# Patient Record
Sex: Female | Born: 1980 | Race: Asian | Hispanic: No | Marital: Married | Smoking: Never smoker
Health system: Southern US, Community
[De-identification: ages and names within clinical notes are randomized; demographics above are authoritative.]

---

## 2003-08-01 ENCOUNTER — Inpatient Hospital Stay (HOSPITAL_COMMUNITY): Admission: AD | Admit: 2003-08-01 | Discharge: 2003-08-01 | Payer: Self-pay | Admitting: *Deleted

## 2003-08-29 ENCOUNTER — Inpatient Hospital Stay (HOSPITAL_COMMUNITY): Admission: AD | Admit: 2003-08-29 | Discharge: 2003-08-29 | Payer: Self-pay | Admitting: *Deleted

## 2003-09-11 ENCOUNTER — Inpatient Hospital Stay (HOSPITAL_COMMUNITY): Admission: RE | Admit: 2003-09-11 | Discharge: 2003-09-11 | Payer: Self-pay | Admitting: *Deleted

## 2003-10-10 ENCOUNTER — Other Ambulatory Visit: Admission: RE | Admit: 2003-10-10 | Discharge: 2003-10-10 | Payer: Self-pay | Admitting: Obstetrics and Gynecology

## 2004-04-28 ENCOUNTER — Inpatient Hospital Stay (HOSPITAL_COMMUNITY): Admission: AD | Admit: 2004-04-28 | Discharge: 2004-05-02 | Payer: Self-pay | Admitting: Obstetrics and Gynecology

## 2005-04-20 IMAGING — US US OB TRANSVAGINAL MODIFY
1 series · 14 of 28 positions shown · non-contrast
Comparison: none

CLINICAL DATA: Abdominal pain.  By dates the patient is expected to be 6 weeks pregnant.
 EARLY OBSTETRICAL ULTRASOUND WITH TRANSVAGINAL:
 Right ovary is 24 x 41 mm, unremarkable.  There is a gestational sac in the uterine cavity near the fundus with a yolk sac evident.  Left ovary is 16 x 18 x 26 mm.  Gestational sac mean diameter of 12.5 mm corresponding to an estimated gestational age of 6 weeks 1 day.  Fetal pole is noted with crown rump length of 3 mm corresponding to an estimated gestational age of 5 weeks 6 days.  No free fluid.
 IMPRESSION
 6 week 1 day intrauterine gestation without apparent complication.

[Series 1: unknown · 0.22mm/px · 14 of 29 slices shown]
[im 2/29]
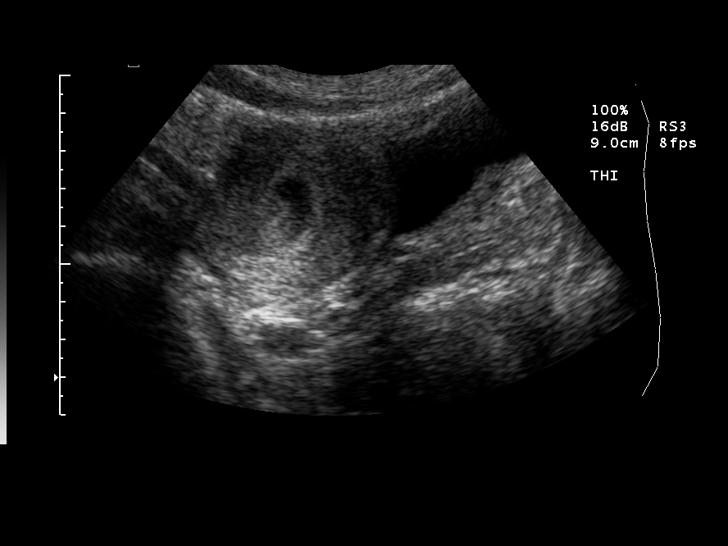
[im 4/29]
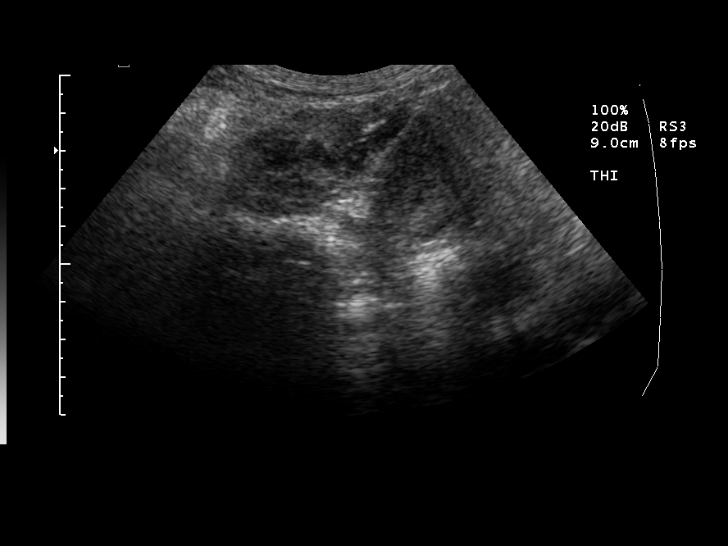
[im 6/29]
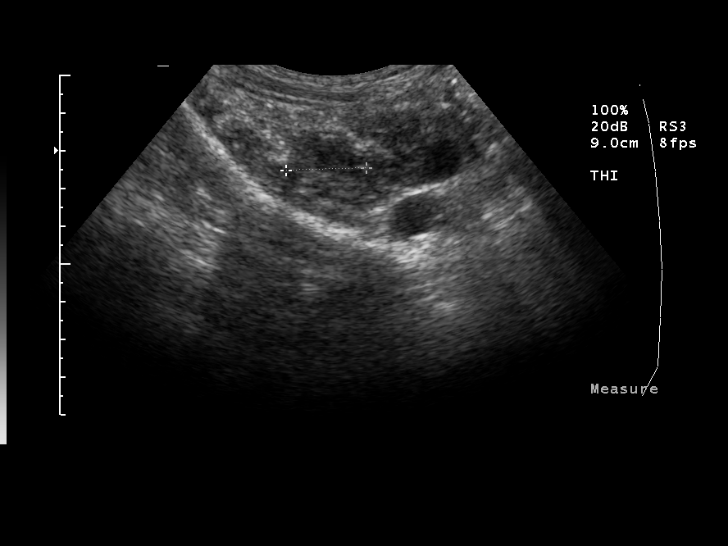
[im 8/29]
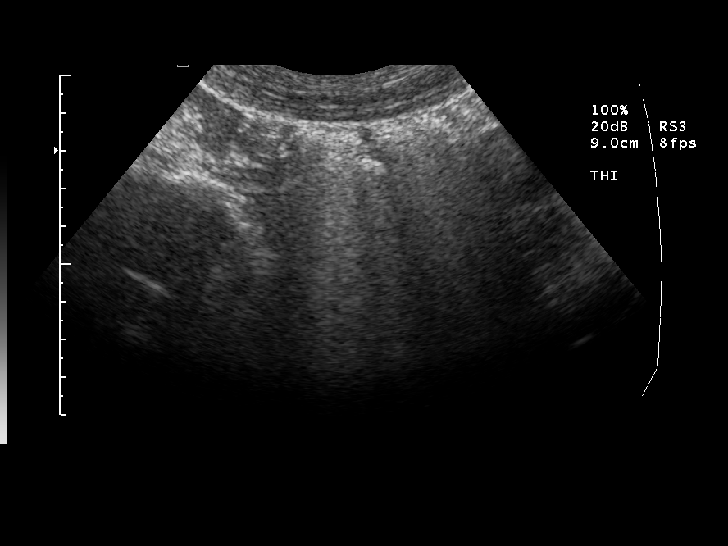
[im 10/29]
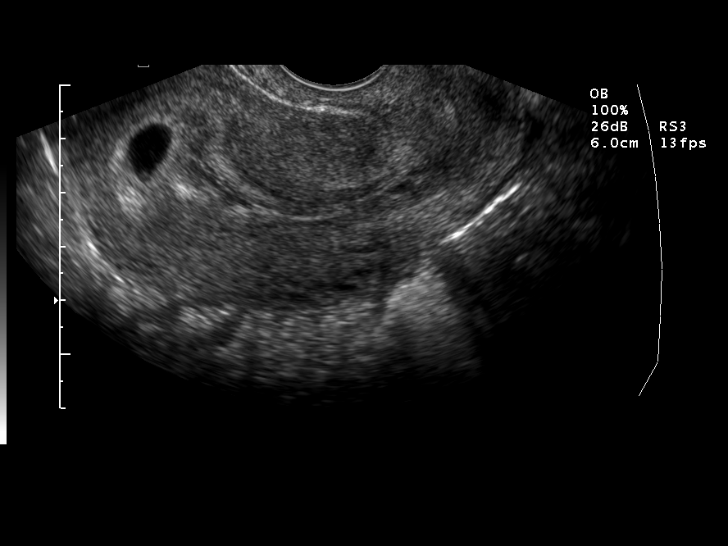
[im 12/29]
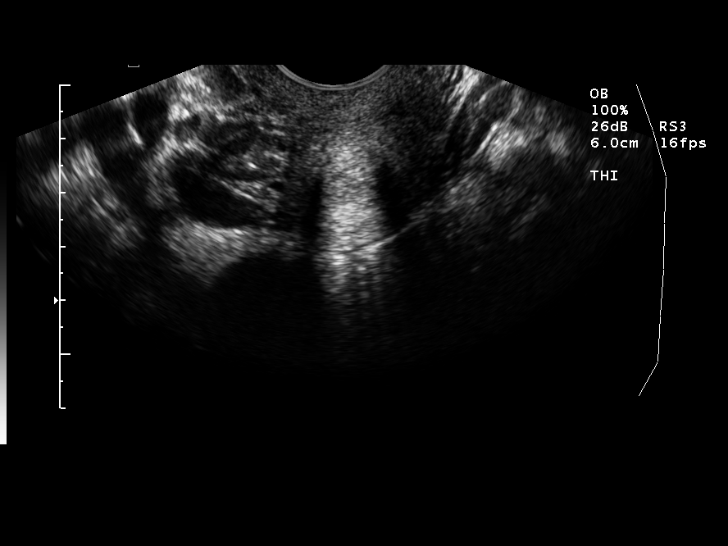
[im 14/29]
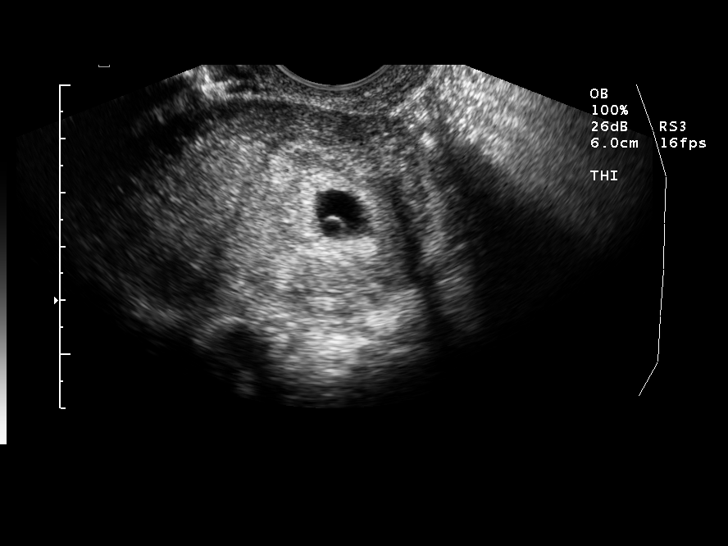
[im 16/29]
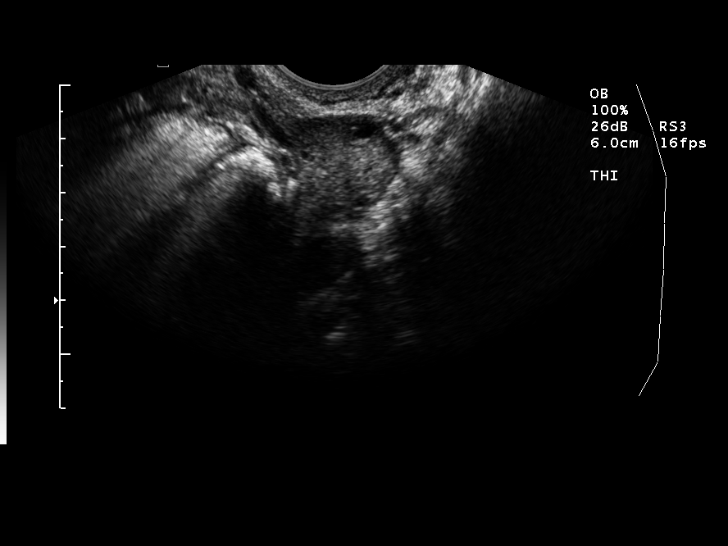
[im 18/29]
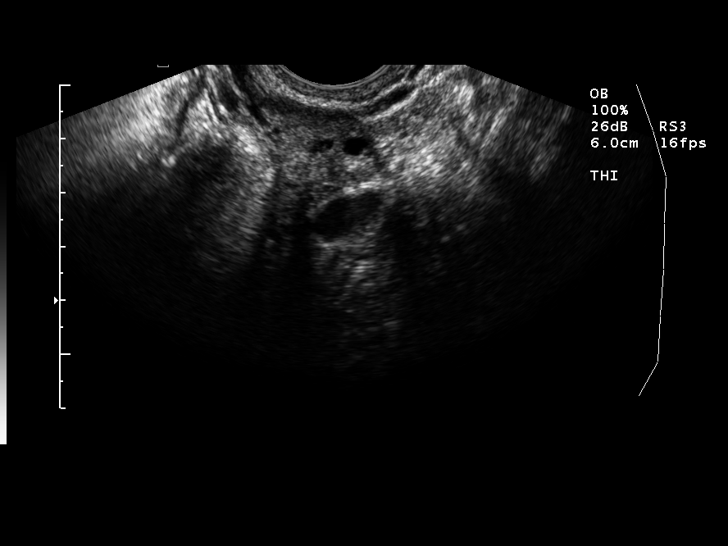
[im 20/29]
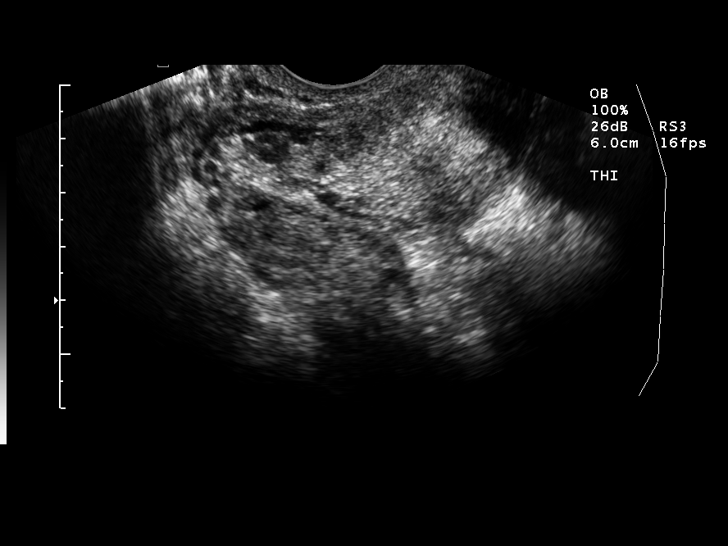
[im 22/29]
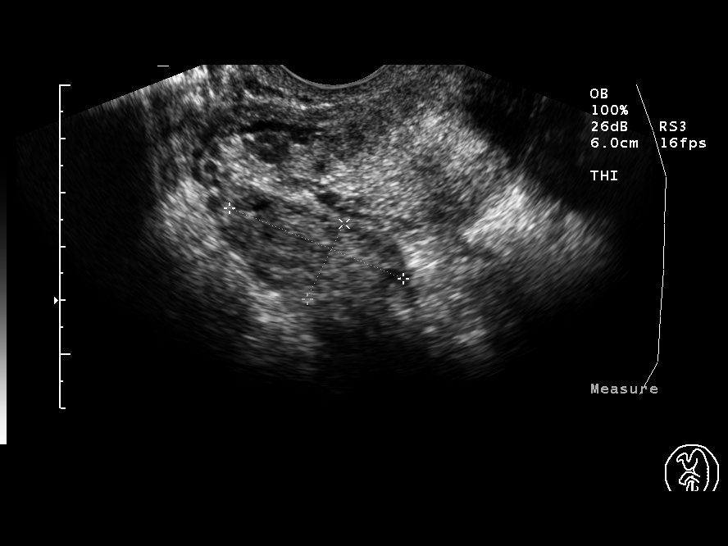
[im 24/29]
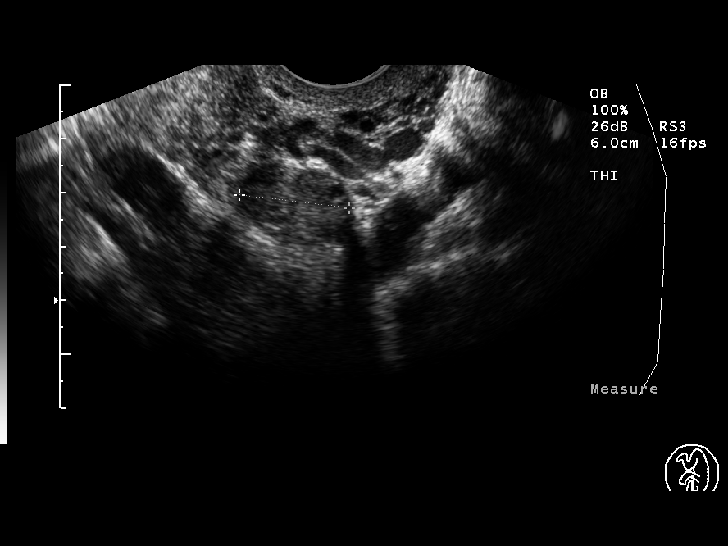
[im 26/29]
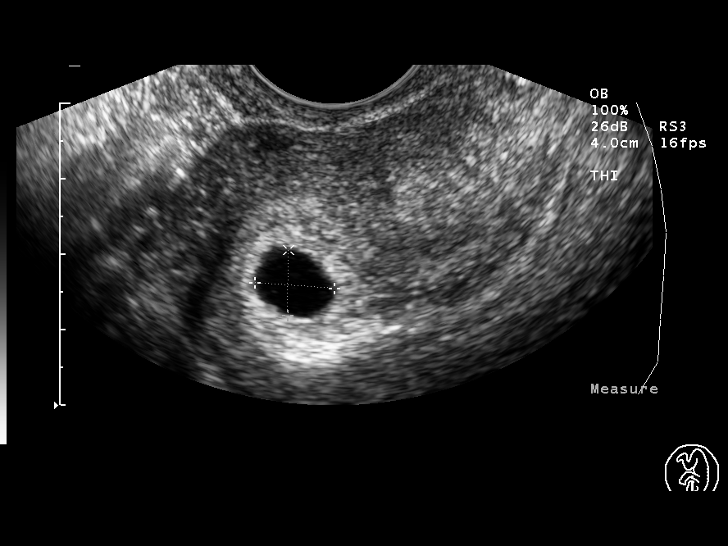
[im 29/29]
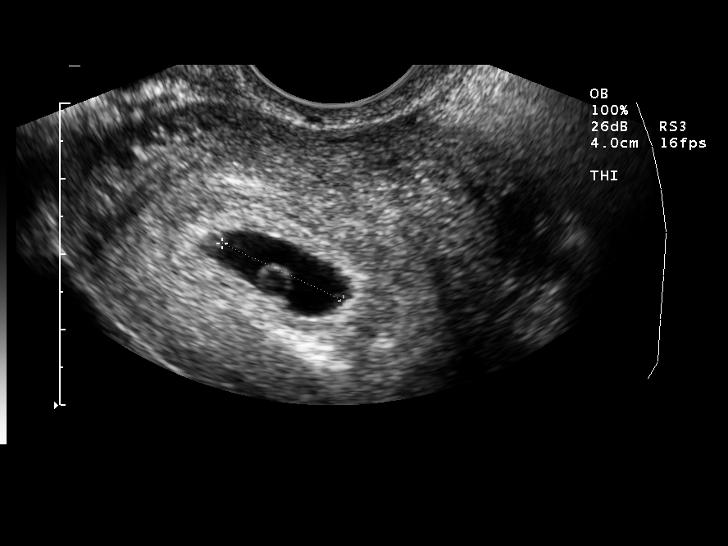

[14 of 28 positions shown; findings below may reference images not displayed]

## 2005-05-03 IMAGING — US US OB TRANSVAGINAL
1 series · 14 of 19 positions shown · non-contrast
Comparison: none

CLINICAL DATA: Reassess cardiac activity.
 TRANSVAGINAL OBSTETRICAL ULTRASOUND:
 Comparison is made with the previous exam dated 08/29/03.
 Multiple images of the gravid uterus were obtained using an endovaginal approach.
 There is a single intrauterine pregnancy identified that demonstrates an estimated gestational age by crown rump length of 8 weeks and 0 days.  Positive regular fetal cardiac activity with a rate of 172 bpm was noted.  A normal appearing yolk sac is seen.  No evidence for subchorionic hemorrhage is noted.
 Both ovaries are seen and have a normal appearance with the left ovary measuring 2.9 x 1.5 x 1.6 cm and the right ovary measuring 3.1 x 1.7 x 1.9 cm.  The right ovary contains a corpus luteum cyst.  No cul-de-sac or periovarian fluid is seen.
 IMPRESSION
 8 week 0 day living intrauterine pregnancy.  Normal ovaries.

[Series 1: unknown · 0.24mm/px · 14 of 19 slices shown]
[im 1/19]
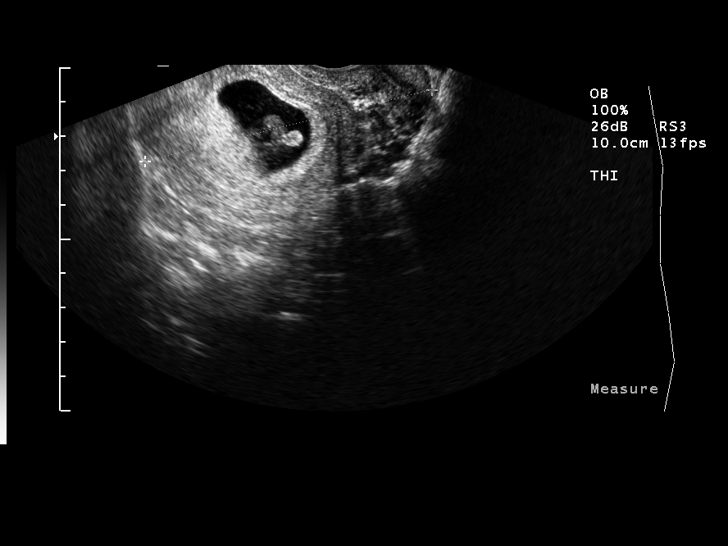
[im 3/19]
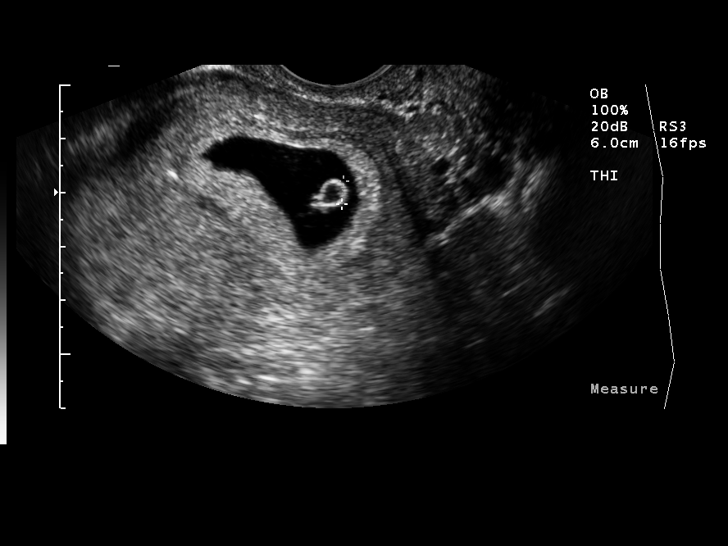
[im 4/19]
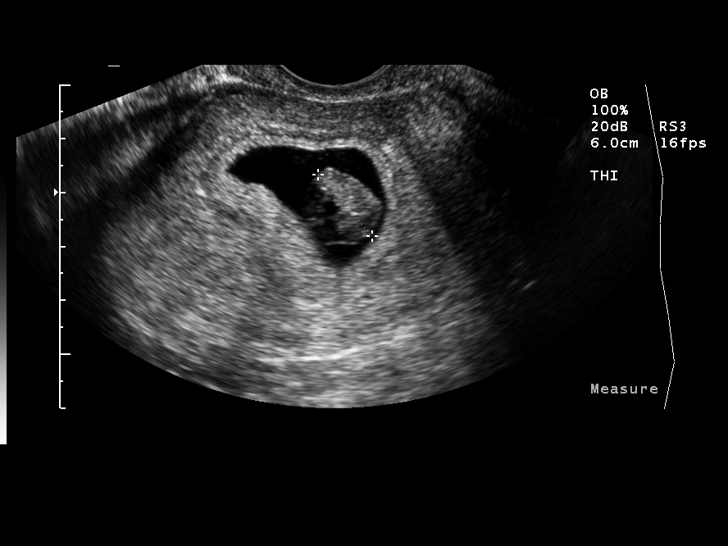
[im 5/19]
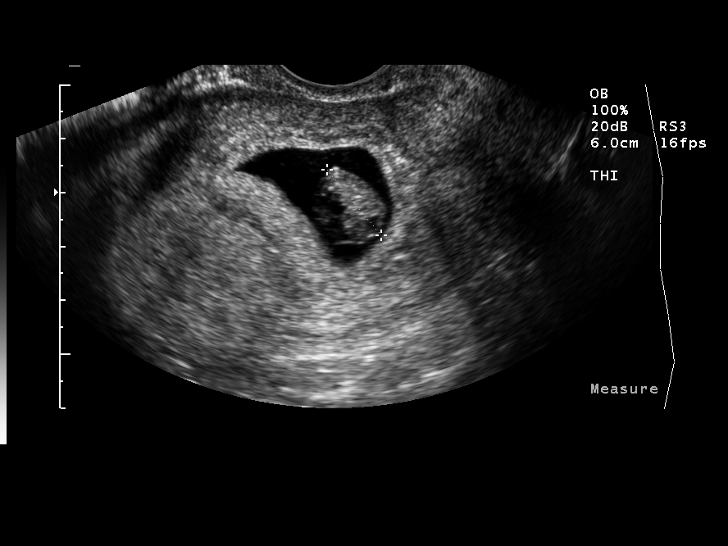
[im 7/19]
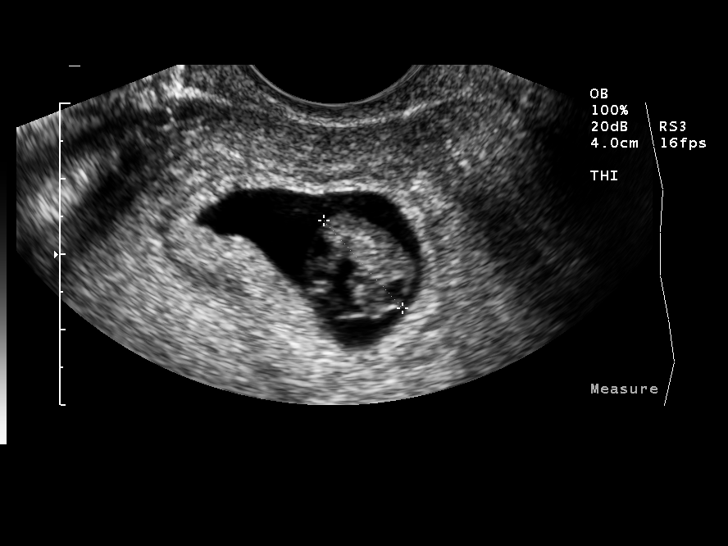
[im 8/19]
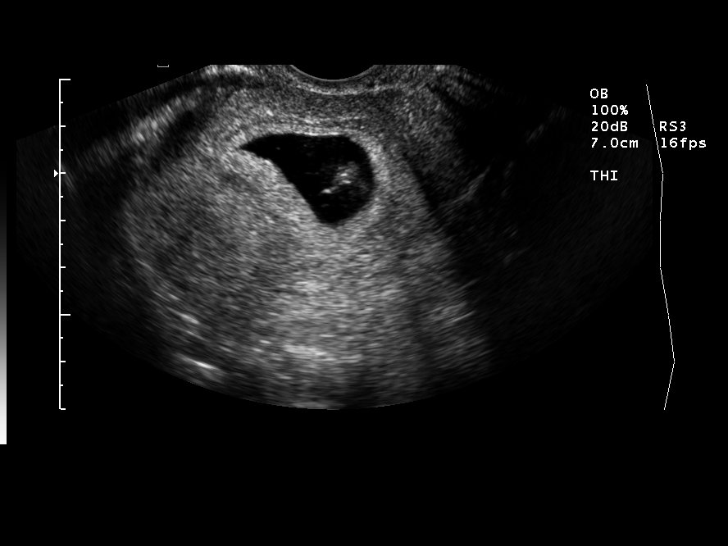
[im 9/19]
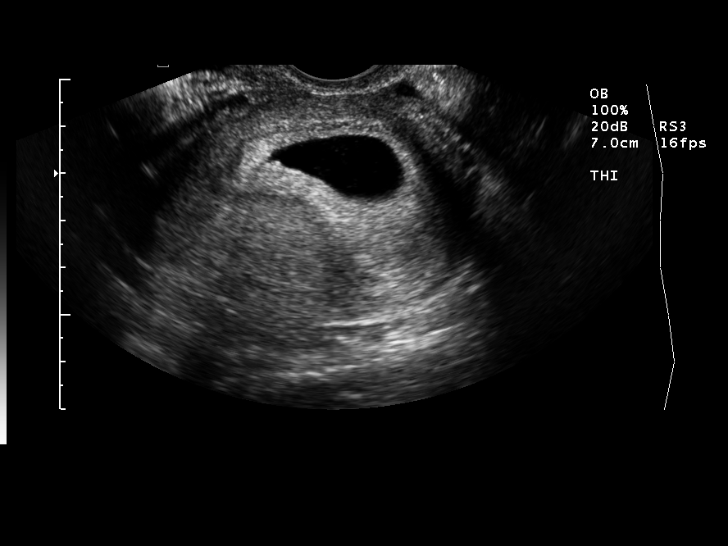
[im 11/19]
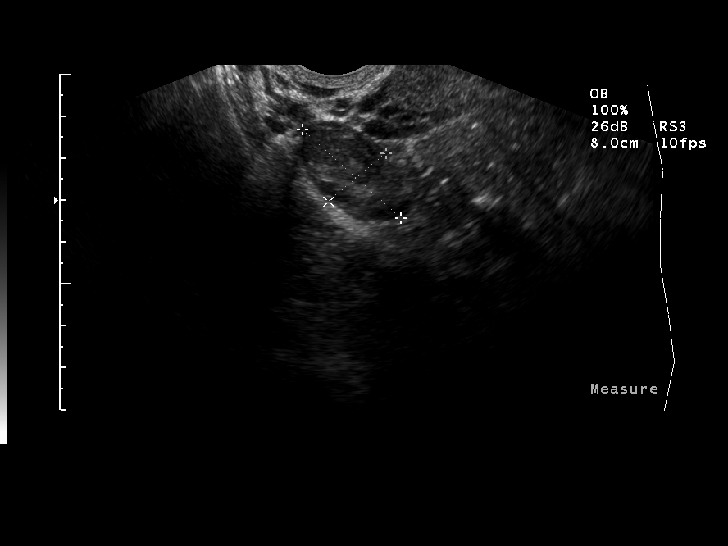
[im 12/19]
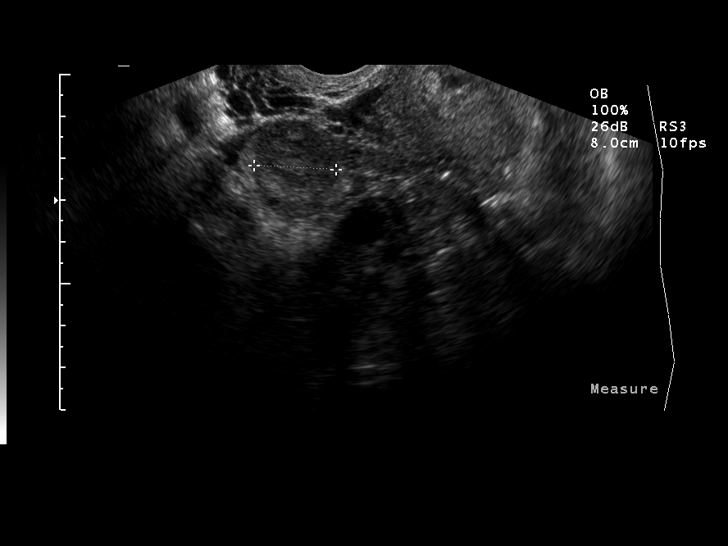
[im 13/19]
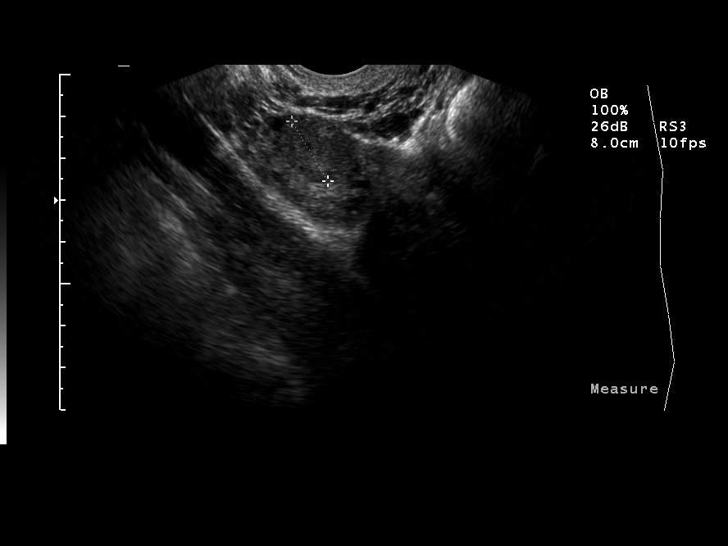
[im 15/19]
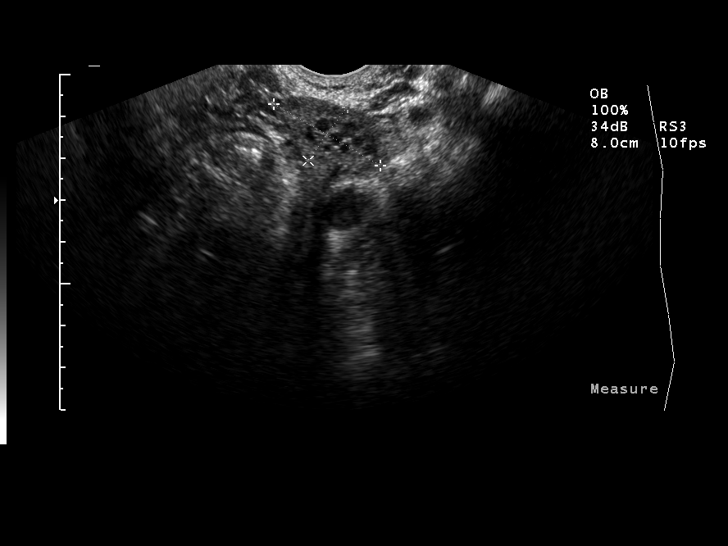
[im 16/19]
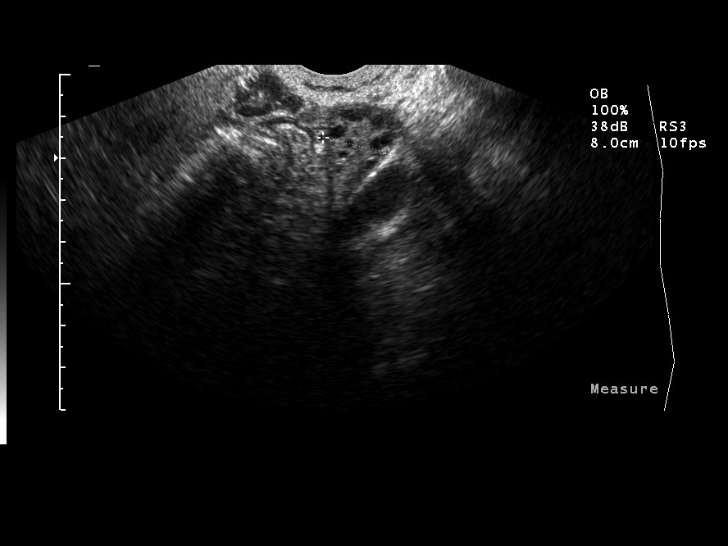
[im 17/19]
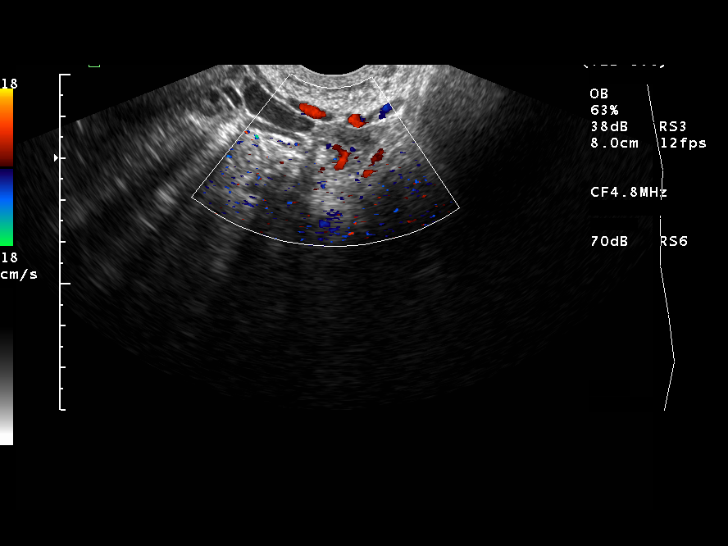
[im 19/19]
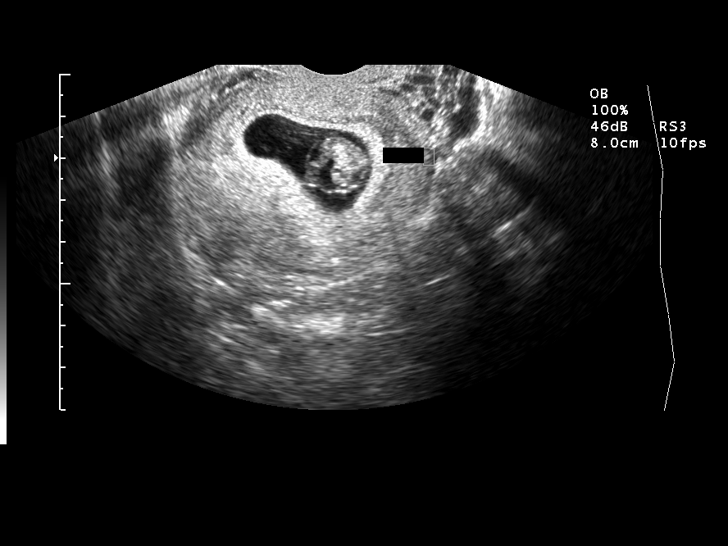

[14 of 19 positions shown; findings below may reference images not displayed]

## 2006-03-25 ENCOUNTER — Ambulatory Visit (HOSPITAL_BASED_OUTPATIENT_CLINIC_OR_DEPARTMENT_OTHER): Admission: RE | Admit: 2006-03-25 | Discharge: 2006-03-25 | Payer: Self-pay | Admitting: Surgery

## 2006-03-26 ENCOUNTER — Emergency Department (HOSPITAL_COMMUNITY): Admission: EM | Admit: 2006-03-26 | Discharge: 2006-03-26 | Payer: Self-pay | Admitting: Emergency Medicine

## 2011-01-31 ENCOUNTER — Other Ambulatory Visit: Payer: Self-pay | Admitting: Family Medicine

## 2011-01-31 ENCOUNTER — Other Ambulatory Visit (HOSPITAL_COMMUNITY)
Admission: RE | Admit: 2011-01-31 | Discharge: 2011-01-31 | Disposition: A | Discharge status: Home / Self Care | Payer: BC Managed Care – PPO | Source: Ambulatory Visit | Attending: Family Medicine | Admitting: Family Medicine

## 2011-01-31 DIAGNOSIS — Z01419 Encounter for gynecological examination (general) (routine) without abnormal findings: Secondary | ICD-10-CM | POA: Insufficient documentation

## 2014-02-13 ENCOUNTER — Other Ambulatory Visit: Payer: Self-pay | Admitting: Family Medicine

## 2014-02-13 ENCOUNTER — Other Ambulatory Visit (HOSPITAL_COMMUNITY)
Admission: RE | Admit: 2014-02-13 | Discharge: 2014-02-13 | Disposition: A | Discharge status: Home / Self Care | Payer: BC Managed Care – PPO | Source: Ambulatory Visit | Attending: Family Medicine | Admitting: Family Medicine

## 2014-02-13 DIAGNOSIS — Z01419 Encounter for gynecological examination (general) (routine) without abnormal findings: Secondary | ICD-10-CM | POA: Diagnosis present

## 2014-02-14 LAB — CYTOLOGY - PAP

## 2014-03-13 ENCOUNTER — Ambulatory Visit (INDEPENDENT_AMBULATORY_CARE_PROVIDER_SITE_OTHER): Payer: BC Managed Care – PPO | Admitting: Emergency Medicine

## 2014-03-13 VITALS — BP 104/68 | HR 86 | Temp 98.0°F | Resp 20 | Ht 61.0 in | Wt 109.1 lb

## 2014-03-13 DIAGNOSIS — N3001 Acute cystitis with hematuria: Secondary | ICD-10-CM

## 2014-03-13 DIAGNOSIS — N3000 Acute cystitis without hematuria: Secondary | ICD-10-CM

## 2014-03-13 DIAGNOSIS — R300 Dysuria: Secondary | ICD-10-CM

## 2014-03-13 LAB — POCT UA - MICROSCOPIC ONLY
CRYSTALS, UR, HPF, POC: NEGATIVE
Casts, Ur, LPF, POC: NEGATIVE
Mucus, UA: NEGATIVE
YEAST UA: NEGATIVE

## 2014-03-13 LAB — POCT URINALYSIS DIPSTICK
Bilirubin, UA: NEGATIVE
Glucose, UA: NEGATIVE
Ketones, UA: NEGATIVE
NITRITE UA: POSITIVE
PROTEIN UA: 100
SPEC GRAV UA: 1.01
UROBILINOGEN UA: 1
pH, UA: 5.5

## 2014-03-13 MED ORDER — CIPROFLOXACIN HCL 500 MG PO TABS: 500.0000 mg | ORAL_TABLET | Freq: Two times a day (BID) | ORAL | Status: DC

## 2014-03-13 MED ORDER — PHENAZOPYRIDINE HCL 200 MG PO TABS: 200.0000 mg | ORAL_TABLET | Freq: Three times a day (TID) | ORAL | Status: DC | PRN

## 2014-03-13 NOTE — Progress Notes (Signed)
[facility] [address] [place] [address] [phone]  Date:  03/13/2014   Name:  Crystal Mcgee   DOB:  Nov 25, 1980   MRN:  [id number]  PCP:  No PCP Per Patient    Chief Complaint: Dysuria   History of Present Illness:  Crystal Mcgee is a 33 y.o. very pleasant female patient who presents with the following:  Dysuria and urgency.  Started this morning. Some back pain No vaginal bleeding or discharge.  History of prior cystitis. No improvement with over the counter medications or other home remedies.  Denies other complaint or health concern today.    There are no active problems to display for this patient.   History reviewed. No pertinent past medical history.  History reviewed. No pertinent past surgical history.  History  Substance Use Topics  . Smoking status: Never Smoker   . Smokeless tobacco: Never Used  . Alcohol Use: No    Family History  Problem Relation Age of Onset  . Diabetes Father   . Hyperlipidemia Father   . Diabetes Brother     No Known Allergies  Medication list has been reviewed and updated.  No current outpatient prescriptions on file prior to visit.   No current facility-administered medications on file prior to visit.    Review of Systems:  As per HPI, otherwise negative.    Physical Examination: Filed Vitals:   03/13/14 1913  BP: 104/68  Pulse: 86  Temp: 98 F (36.7 C)  Resp: 20   Filed Vitals:   03/13/14 1913  Height:  (1.549 m)  Weight: 109 lb 2 oz (49.499 kg)   Body mass index is 20.63 kg/(m^2). Ideal Body Weight: Weight in (lb) to have BMI = 25: 132  GEN: WDWN, NAD, Non-toxic, A & O x 3 HEENT: Atraumatic, Normocephalic. Neck supple. No masses, No LAD. Ears and Nose: No external deformity. CV: RRR, No M/G/R. No JVD. No thrill. No extra heart sounds. PULM: CTA B, no wheezes, crackles, rhonchi. No retractions. No resp. distress. No accessory muscle use. ABD: S, mild suprapubic  tenderness, ND, +BS. No rebound. No HSM. EXTR: No c/c/e NEURO Normal gait.  PSYCH: Normally interactive. Conversant. Not depressed or anxious appearing.  Calm demeanor.    Assessment and Plan: Cystitis Pyridium cipro  Signed,  Phillips Odor, MD   Results for orders placed in visit on 03/13/14  POCT URINALYSIS DIPSTICK      Result Value Ref Range   Color, UA orange     Clarity, UA cloudy     Glucose, UA neg     Bilirubin, UA neg     Ketones, UA neg     Spec Grav, UA 1.010     Blood, UA large     pH, UA 5.5     Protein, UA 100     Urobilinogen, UA 1.0     Nitrite, UA positive     Leukocytes, UA large (3+)    POCT UA - MICROSCOPIC ONLY      Result Value Ref Range   WBC, Ur, HPF, POC 25-35     RBC, urine, microscopic 40-50     Bacteria, U Microscopic 1+     Mucus, UA neg     Epithelial cells, urine per micros 2-4     Crystals, Ur, HPF, POC neg     Casts, Ur, LPF, POC neg     Yeast, UA neg

## 2014-03-13 NOTE — Patient Instructions (Signed)

## 2014-07-31 ENCOUNTER — Ambulatory Visit (INDEPENDENT_AMBULATORY_CARE_PROVIDER_SITE_OTHER): Payer: BLUE CROSS/BLUE SHIELD | Admitting: Internal Medicine

## 2014-07-31 VITALS — BP 122/74 | HR 109 | Temp 98.5°F | Resp 17 | Ht 61.5 in | Wt 107.0 lb

## 2014-07-31 DIAGNOSIS — J9801 Acute bronchospasm: Secondary | ICD-10-CM

## 2014-07-31 DIAGNOSIS — R05 Cough: Secondary | ICD-10-CM

## 2014-07-31 DIAGNOSIS — R059 Cough, unspecified: Secondary | ICD-10-CM

## 2014-07-31 DIAGNOSIS — R52 Pain, unspecified: Secondary | ICD-10-CM

## 2014-07-31 DIAGNOSIS — R509 Fever, unspecified: Secondary | ICD-10-CM

## 2014-07-31 LAB — POCT INFLUENZA A/B
INFLUENZA A, POC: NEGATIVE
INFLUENZA B, POC: NEGATIVE

## 2014-07-31 MED ORDER — HYDROCOD POLST-CHLORPHEN POLST 10-8 MG/5ML PO LQCR: 5.0000 mL | Freq: Two times a day (BID) | ORAL | Status: AC | PRN

## 2014-07-31 MED ORDER — ALBUTEROL SULFATE HFA 108 (90 BASE) MCG/ACT IN AERS: 2.0000 | INHALATION_SPRAY | Freq: Four times a day (QID) | RESPIRATORY_TRACT | Status: AC | PRN

## 2014-07-31 MED ORDER — AZITHROMYCIN 250 MG PO TABS: ORAL_TABLET | ORAL | Status: AC

## 2014-07-31 NOTE — Progress Notes (Signed)
   Subjective:    Patient ID: Crystal FalconLori H Randol, female    DOB: 01/30/1981, 34 y.o.   MRN: [id number]  HPI CO 3days of progressive cough, body aches, fever, st. Also chest feels congested, no hx of asthma or smoking. No cp or sob. Usually healthy   Review of Systems     Objective:   Physical Exam  Constitutional: She is oriented to person, place, and time. She appears well-developed and well-nourished. She is active and cooperative. She appears ill. No distress.  HENT:  Nose: Mucosal edema, rhinorrhea and sinus tenderness present. Right sinus exhibits no maxillary sinus tenderness and no frontal sinus tenderness. Left sinus exhibits no maxillary sinus tenderness and no frontal sinus tenderness.  Mouth/Throat: Posterior oropharyngeal erythema present.  Pulmonary/Chest: Effort normal. No tachypnea. No respiratory distress. She has no decreased breath sounds. She has wheezes. She has rhonchi. She has no rales.  Repeat oximetry 97%  Neurological: She is alert and oriented to person, place, and time. She exhibits normal muscle tone. Coordination normal.  Skin: She is diaphoretic.  Psychiatric: She has a normal mood and affect. Her behavior is normal.    Flu test Results for orders placed or performed in visit on 07/31/14  POCT Influenza A/B  Result Value Ref Range   Influenza A, POC Negative    Influenza B, POC Negative          Assessment & Plan:  Bronchitis/Bronchospasm Tussionex/albuterol/Zithromax

## 2014-07-31 NOTE — Patient Instructions (Signed)
Bronchospasm A bronchospasm is a spasm or tightening of the airways going into the lungs. During a bronchospasm breathing becomes more difficult because the airways get smaller. When this happens there can be coughing, a whistling sound when breathing (wheezing), and difficulty breathing. Bronchospasm is often associated with asthma, but not all patients who experience a bronchospasm have asthma. CAUSES  A bronchospasm is caused by inflammation or irritation of the airways. The inflammation or irritation may be triggered by:   Allergies (such as to animals, pollen, food, or mold). Allergens that cause bronchospasm may cause wheezing immediately after exposure or many hours later.   Infection. Viral infections are believed to be the most common cause of bronchospasm.   Exercise.   Irritants (such as pollution, cigarette smoke, strong odors, aerosol sprays, and paint fumes).   Weather changes. Winds increase molds and pollens in the air. Rain refreshes the air by washing irritants out. Cold air may cause inflammation.   Stress and emotional upset.  SIGNS AND SYMPTOMS   Wheezing.   Excessive nighttime coughing.   Frequent or severe coughing with a simple cold.   Chest tightness.   Shortness of breath.  DIAGNOSIS  Bronchospasm is usually diagnosed through a history and physical exam. Tests, such as chest X-rays, are sometimes done to look for other conditions. TREATMENT   Inhaled medicines can be given to open up your airways and help you breathe. The medicines can be given using either an inhaler or a nebulizer machine.  Corticosteroid medicines may be given for severe bronchospasm, usually when it is associated with asthma. HOME CARE INSTRUCTIONS   Always have a plan prepared for seeking medical care. Know when to call your health care provider and local emergency services (911 in the [place] Know where you can access local emergency care.  Only take medicines as  directed by your health care provider.  If you were prescribed an inhaler or nebulizer machine, ask your health care provider to explain how to use it correctly. Always use a spacer with your inhaler if you were given one.  It is necessary to remain calm during an attack. Try to relax and breathe more slowly.  Control your home environment in the following ways:   Change your heating and air conditioning filter at least once a month.   Limit your use of fireplaces and wood stoves.  Do not smoke and do not allow smoking in your home.   Avoid exposure to perfumes and fragrances.   Get rid of pests (such as roaches and mice) and their droppings.   Throw away plants if you see mold on them.   Keep your house clean and dust free.   Replace carpet with wood, tile, or vinyl flooring. Carpet can trap dander and dust.   Use allergy-proof pillows, mattress covers, and box spring covers.   Wash bed sheets and blankets every week in hot water and dry them in a dryer.   Use blankets that are made of polyester or cotton.   Wash hands frequently. SEEK MEDICAL CARE IF:   You have muscle aches.   You have chest pain.   The sputum changes from clear or white to yellow, green, gray, or bloody.   The sputum you cough up gets thicker.   There are problems that may be related to the medicine you are given, such as a rash, itching, swelling, or trouble breathing.  SEEK IMMEDIATE MEDICAL CARE IF:   You have worsening wheezing and coughing even   after taking your prescribed medicines.   You have increased difficulty breathing.   You develop severe chest pain. MAKE SURE YOU:   Understand these instructions.  Will watch your condition.  Will get help right away if you are not doing well or get worse. Document Released: 06/05/2003 Document Revised: 06/07/2013 Document Reviewed: 11/22/2012 [employer] Patient Information 2015 [employer]. This information is not  intended to replace advice given to you by your health care provider. Make sure you discuss any questions you have with your health care provider. Acute Bronchitis Bronchitis is inflammation of the airways that extend from the windpipe into the lungs (bronchi). The inflammation often causes mucus to develop. This leads to a cough, which is the most common symptom of bronchitis.  In acute bronchitis, the condition usually develops suddenly and goes away over time, usually in a couple weeks. Smoking, allergies, and asthma can make bronchitis worse. Repeated episodes of bronchitis may cause further lung problems.  CAUSES Acute bronchitis is most often caused by the same virus that causes a cold. The virus can spread from person to person (contagious) through coughing, sneezing, and touching contaminated objects. SIGNS AND SYMPTOMS   Cough.   Fever.   Coughing up mucus.   Body aches.   Chest congestion.   Chills.   Shortness of breath.   Sore throat.  DIAGNOSIS  Acute bronchitis is usually diagnosed through a physical exam. Your health care provider will also ask you questions about your medical history. Tests, such as chest X-rays, are sometimes done to rule out other conditions.  TREATMENT  Acute bronchitis usually goes away in a couple weeks. Oftentimes, no medical treatment is necessary. Medicines are sometimes given for relief of fever or cough. Antibiotic medicines are usually not needed but may be prescribed in certain situations. In some cases, an inhaler may be recommended to help reduce shortness of breath and control the cough. A cool mist vaporizer may also be used to help thin bronchial secretions and make it easier to clear the chest.  HOME CARE INSTRUCTIONS  Get plenty of rest.   Drink enough fluids to keep your urine clear or pale yellow (unless you have a medical condition that requires fluid restriction). Increasing fluids may help thin your respiratory  secretions (sputum) and reduce chest congestion, and it will prevent dehydration.   Take medicines only as directed by your health care provider.  If you were prescribed an antibiotic medicine, finish it all even if you start to feel better.  Avoid smoking and secondhand smoke. Exposure to cigarette smoke or irritating chemicals will make bronchitis worse. If you are a smoker, consider using nicotine gum or skin patches to help control withdrawal symptoms. Quitting smoking will help your lungs heal faster.   Reduce the chances of another bout of acute bronchitis by washing your hands frequently, avoiding people with cold symptoms, and trying not to touch your hands to your mouth, nose, or eyes.   Keep all follow-up visits as directed by your health care provider.  SEEK MEDICAL CARE IF: Your symptoms do not improve after 1 week of treatment.  SEEK IMMEDIATE MEDICAL CARE IF:  You develop an increased fever or chills.   You have chest pain.   You have severe shortness of breath.  You have bloody sputum.   You develop dehydration.  You faint or repeatedly feel like you are going to pass out.  You develop repeated vomiting.  You develop a severe headache. MAKE SURE YOU:     Understand these instructions.  Will watch your condition.  Will get help right away if you are not doing well or get worse. Document Released: 07/10/2004 Document Revised: 10/17/2013 Document Reviewed: 11/23/2012 [employer] Patient Information 2015 [employer]. This information is not intended to replace advice given to you by your health care provider. Make sure you discuss any questions you have with your health care provider.  

## 2017-09-08 DIAGNOSIS — Z0000 Encounter for general adult medical examination without abnormal findings: Secondary | ICD-10-CM | POA: Diagnosis not present

## 2017-09-14 ENCOUNTER — Other Ambulatory Visit: Payer: Self-pay | Admitting: Family Medicine

## 2017-09-14 ENCOUNTER — Other Ambulatory Visit (HOSPITAL_COMMUNITY)
Admission: RE | Admit: 2017-09-14 | Discharge: 2017-09-14 | Disposition: A | Discharge status: Home / Self Care | Payer: BLUE CROSS/BLUE SHIELD | Source: Ambulatory Visit | Attending: Family Medicine | Admitting: Family Medicine

## 2017-09-14 DIAGNOSIS — Z124 Encounter for screening for malignant neoplasm of cervix: Secondary | ICD-10-CM | POA: Insufficient documentation

## 2017-09-14 DIAGNOSIS — Z0000 Encounter for general adult medical examination without abnormal findings: Secondary | ICD-10-CM | POA: Diagnosis not present

## 2017-09-15 LAB — CYTOLOGY - PAP
Adequacy: ABSENT
DIAGNOSIS: NEGATIVE
HPV (WINDOPATH): NOT DETECTED

## 2018-03-29 DIAGNOSIS — K648 Other hemorrhoids: Secondary | ICD-10-CM | POA: Diagnosis not present

## 2018-04-19 DIAGNOSIS — K648 Other hemorrhoids: Secondary | ICD-10-CM | POA: Diagnosis not present

## 2019-03-16 DIAGNOSIS — Z8249 Family history of ischemic heart disease and other diseases of the circulatory system: Secondary | ICD-10-CM | POA: Diagnosis not present

## 2019-03-16 DIAGNOSIS — Z833 Family history of diabetes mellitus: Secondary | ICD-10-CM | POA: Diagnosis not present

## 2019-03-16 DIAGNOSIS — Z0000 Encounter for general adult medical examination without abnormal findings: Secondary | ICD-10-CM | POA: Diagnosis not present

## 2019-06-02 ENCOUNTER — Ambulatory Visit: Payer: BC Managed Care – PPO | Attending: Internal Medicine

## 2019-06-02 ENCOUNTER — Other Ambulatory Visit: Payer: BLUE CROSS/BLUE SHIELD

## 2019-06-02 DIAGNOSIS — Z20828 Contact with and (suspected) exposure to other viral communicable diseases: Secondary | ICD-10-CM | POA: Diagnosis not present

## 2019-06-02 DIAGNOSIS — Z20822 Contact with and (suspected) exposure to covid-19: Secondary | ICD-10-CM

## 2019-06-04 LAB — NOVEL CORONAVIRUS, NAA: SARS-CoV-2, NAA: DETECTED — AB

## 2020-01-26 DIAGNOSIS — Z20828 Contact with and (suspected) exposure to other viral communicable diseases: Secondary | ICD-10-CM | POA: Diagnosis not present

## 2021-06-27 DIAGNOSIS — Z0000 Encounter for general adult medical examination without abnormal findings: Secondary | ICD-10-CM | POA: Diagnosis not present

## 2021-06-27 DIAGNOSIS — Z13220 Encounter for screening for lipoid disorders: Secondary | ICD-10-CM | POA: Diagnosis not present

## 2021-06-27 DIAGNOSIS — Z23 Encounter for immunization: Secondary | ICD-10-CM | POA: Diagnosis not present

## 2021-06-27 DIAGNOSIS — Z124 Encounter for screening for malignant neoplasm of cervix: Secondary | ICD-10-CM | POA: Diagnosis not present

## 2021-06-27 DIAGNOSIS — R899 Unspecified abnormal finding in specimens from other organs, systems and tissues: Secondary | ICD-10-CM | POA: Diagnosis not present

## 2021-10-17 DIAGNOSIS — K602 Anal fissure, unspecified: Secondary | ICD-10-CM | POA: Diagnosis not present

## 2023-09-15 ENCOUNTER — Other Ambulatory Visit: Payer: Self-pay | Admitting: Family Medicine

## 2023-09-15 DIAGNOSIS — Z1231 Encounter for screening mammogram for malignant neoplasm of breast: Secondary | ICD-10-CM

## 2023-10-13 ENCOUNTER — Ambulatory Visit
Admission: RE | Admit: 2023-10-13 | Discharge: 2023-10-13 | Disposition: A | Discharge status: Home / Self Care | Payer: Self-pay | Source: Ambulatory Visit | Attending: Family Medicine | Admitting: Family Medicine

## 2023-10-13 DIAGNOSIS — Z1231 Encounter for screening mammogram for malignant neoplasm of breast: Secondary | ICD-10-CM

## 2023-10-16 ENCOUNTER — Other Ambulatory Visit: Payer: Self-pay | Admitting: Family Medicine

## 2023-10-16 DIAGNOSIS — R928 Other abnormal and inconclusive findings on diagnostic imaging of breast: Secondary | ICD-10-CM

## 2023-10-30 ENCOUNTER — Other Ambulatory Visit

## 2023-10-30 ENCOUNTER — Encounter

## 2023-11-03 ENCOUNTER — Ambulatory Visit
Admission: RE | Admit: 2023-11-03 | Discharge: 2023-11-03 | Disposition: A | Discharge status: Home / Self Care | Source: Ambulatory Visit | Attending: Family Medicine | Admitting: Family Medicine

## 2023-11-03 ENCOUNTER — Ambulatory Visit

## 2023-11-03 DIAGNOSIS — R928 Other abnormal and inconclusive findings on diagnostic imaging of breast: Secondary | ICD-10-CM
